# Patient Record
Sex: Male | Born: 1965 | Race: White | Hispanic: No | Marital: Married | Smoking: Never smoker
Health system: Southern US, Community
[De-identification: ages and names within clinical notes are randomized; demographics above are authoritative.]

## PROBLEM LIST (undated history)

## (undated) DIAGNOSIS — J45909 Unspecified asthma, uncomplicated: Secondary | ICD-10-CM

## (undated) HISTORY — PX: FOOT SURGERY: SHX648

## (undated) HISTORY — DX: Unspecified asthma, uncomplicated: J45.909

---

## 2007-07-11 IMAGING — CT NECK WO/W
2 series · 10 of 14 positions shown, 12 images · non-contrast
Comparison: none

[Series 2: without · axial · non-contrast · 0.49mm/px · z∈[-565,-445]mm · 3 of 48 slices shown]
[im 12/48  bone]
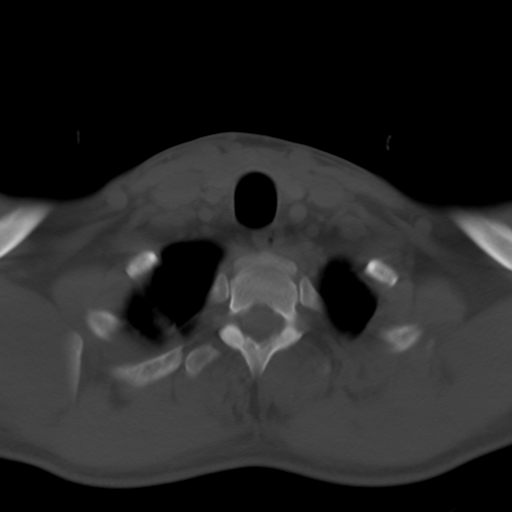
[im 24/48  bone]
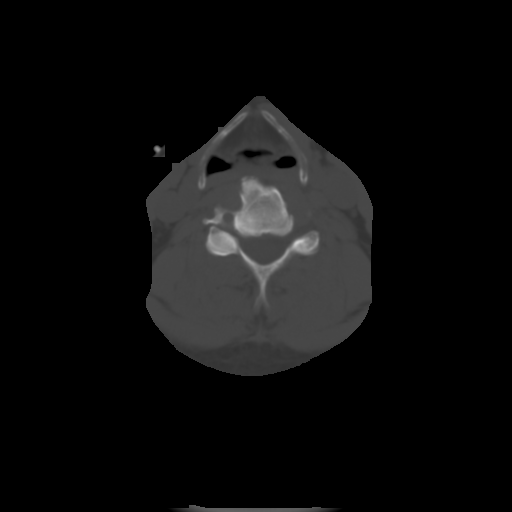
[im 36/48  bone]
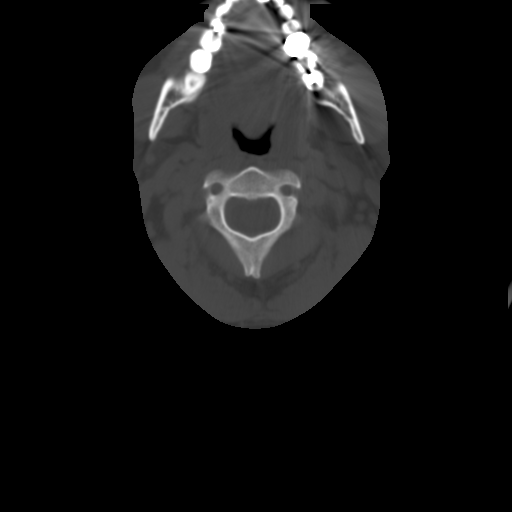

[Series 3: with contrast · axial · 0.49mm/px · z∈[-592,-415]mm · 7 of 79 slices shown, 9 images]
[im 10/79  soft-tissue]
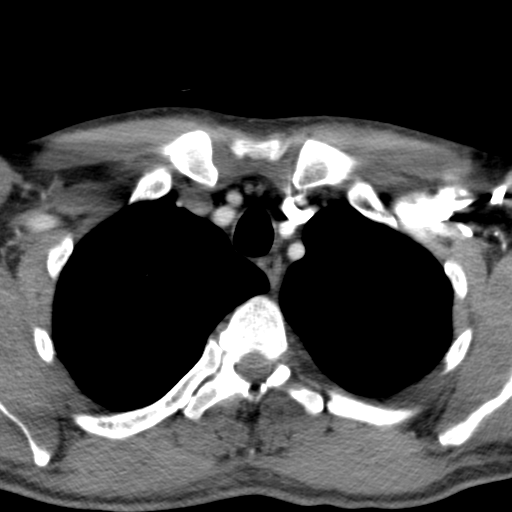
[im 10/79  bone]
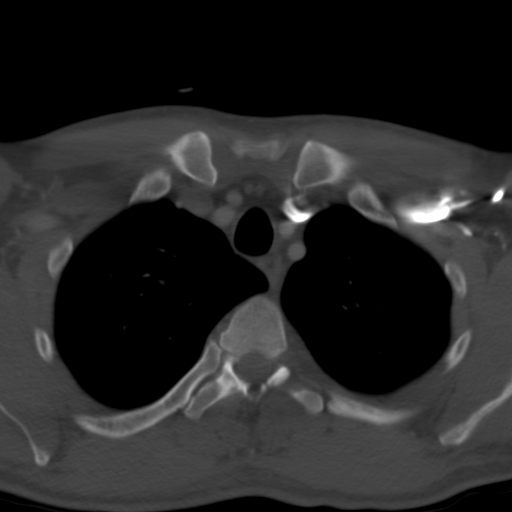
[im 20/79  bone]
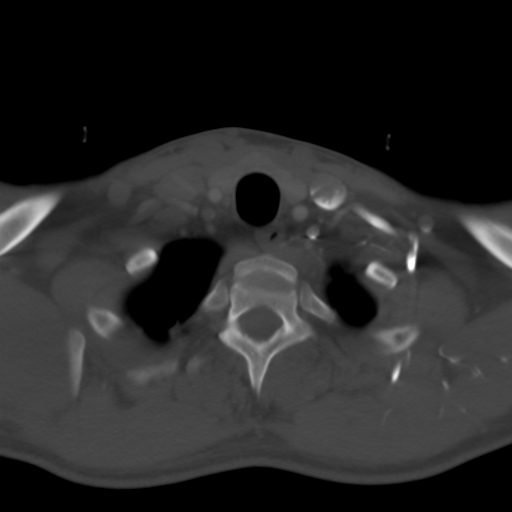
[im 30/79  bone]
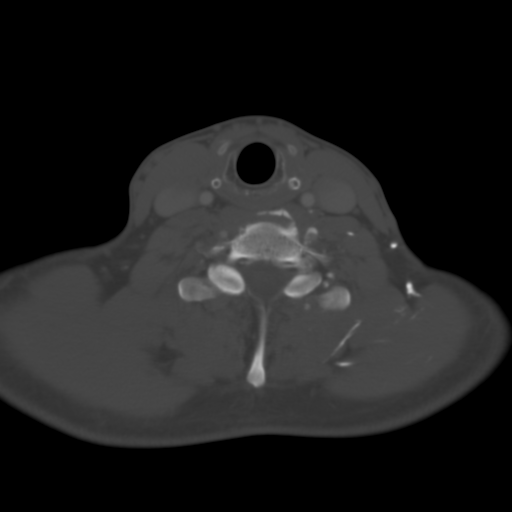
[im 40/79  bone]
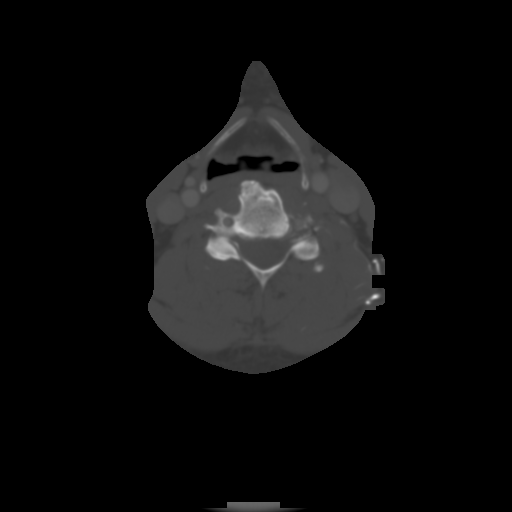
[im 49/79  soft-tissue]
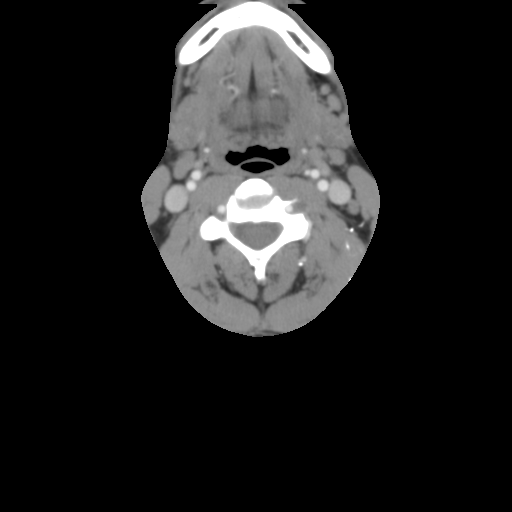
[im 49/79  bone]
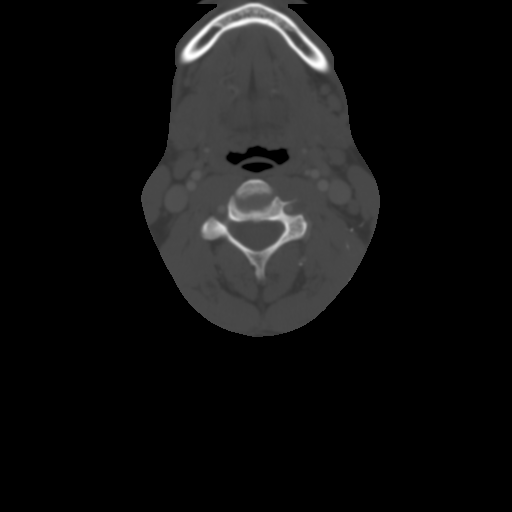
[im 59/79  bone]
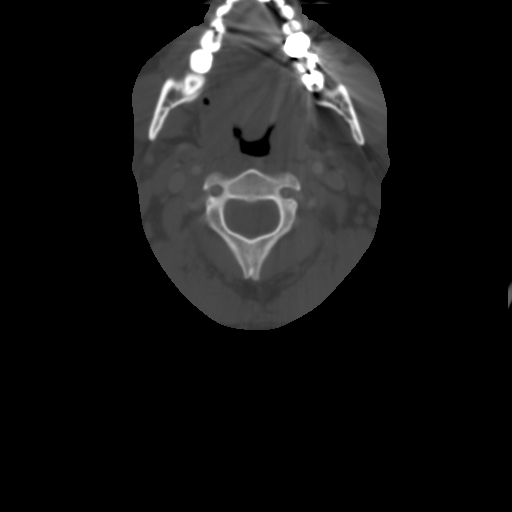
[im 69/79  bone]
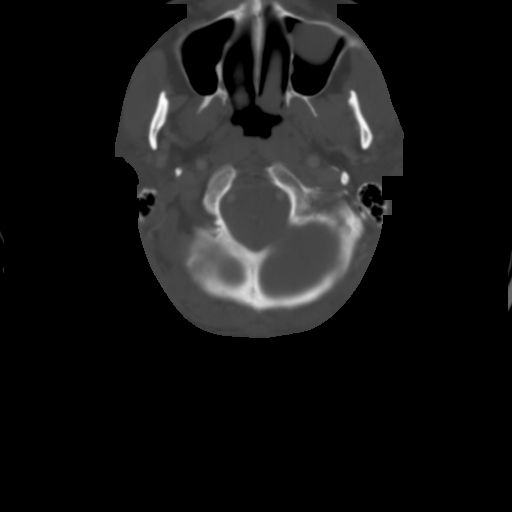

[10 of 14 positions shown; findings below may reference images not displayed]

______________________________________________________________
SANDISO, KWENZOKUHLE 03/06/1954 PANTHER, TOMO [DATE]

REASON FOR CONSULTATION: ROUTINE [id number]
____________________________________________

EXAM: SCREEN BREAST EXAM

The breasts are relatively dense with no significant interval change
observed since prior exam dated 04/25/93. There are a few tiny scattered
calcifications present. Routine follow-up is recommended in one year.

 Patient SANDISO, KWENZOKUHLE [DATE]

## 2008-05-12 ENCOUNTER — Ambulatory Visit (HOSPITAL_BASED_OUTPATIENT_CLINIC_OR_DEPARTMENT_OTHER): Admission: RE | Admit: 2008-05-12 | Discharge: 2008-05-12 | Payer: Self-pay | Admitting: Orthopedic Surgery

## 2010-12-05 NOTE — Op Note (Signed)
Matthew Huerta, Matthew Huerta               ACCOUNT NO.:  [id number]   MEDICAL RECORD NO.:  [id number]          PATIENT TYPE:  AMB   LOCATION:                               FACILITY:  [facility]   PHYSICIAN:  Feliberto Gottron. Turner Daniels, M.D.   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   PROCEDURE:  Left shoulder arthroscopic anterior-inferior acromioplasty  and distal clavicle excision.   SURGEON:  Feliberto Gottron. Turner Daniels, MD   FIRST ASSISTANT:  Shirl Harris, PA-C   ANESTHETIC:  General endotracheal with interscalene block.   ESTIMATED BLOOD LOSS:  Minimal.   FLUID REPLACEMENT:  700 mL of crystalloid.   DRAINS PLACED:  None.   TOURNIQUET TIME:  None.   INDICATIONS FOR PROCEDURE:  A 45 year old gentleman who was sent to our  office with an MRI scan showing a high-grade rotator cuff tear about a  week ago.  He was injured back in June, has had persistent pain,  catching and popping in the shoulder, had the MRI scan accomplished by  his primary care physician and was sent to [place] for consultation.  When we  saw him in the office, he had significant impingement, AC joint  tenderness.  Cross-chest adduction test was positive and based on these  clinical findings plus the MRI scan, it was felt that he would benefit  from arthroscopic evaluation and treatment of his shoulder with a mini  rotator cuff repair of the MRI was accurate.  Risks and benefits of  surgery were discussed and questions answered.   DESCRIPTION OF PROCEDURE:  The patient was identified by armband and  underwent left shoulder interscalene block anesthetic at the [facility] and then was taken  to operating room 1 where the  appropriate anesthetic monitors were reattached and general endotracheal  anesthesia was induced with the patient in the supine position.  He was  then placed in the beach-chair position.  The left upper extremity was  prepped and draped in usual sterile fashion from the wrist to the  hemithorax.  Time-out procedure was performed and we began the operation  itself by making standard portals with a #11 blade, 1.5 cm anterior to  the [facility] joint lateral to the junction of middle and posterior surface of  the acromion and posterolateral corner of the acromion process.  The  inflow was placed anteriorly.  The arthroscope laterally and a 4.2 great  white sucker shaver posteriorly.  Diagnostic arthroscopy revealed an  inflamed subacromial bursa, which was removed.  The patient has a very  large and sharp type 3 subacromial spur that was removed with a 4.5  hooded vortex bur and was about 1.2-cm in depth.  The [facility] joint was  arthritic and the inferior distal centimeter of the clavicle was also  excised with  the bur.  The rotator cuff was carefully examined  externally and had no significant tearing, although it was inflamed.  At  this point, we switched portals bringing the scope in posteriorly, the  inflow laterally, and the bur anteriorly, and completed our distal  clavicle excision.  Photographic documentation was made of this.  The  superior clavicular acromial ligament was left intact.  At this point,  the arthroscope was repositioned into the glenohumeral joint using a  posterior portal where we diagnosed a normal subscapularis insertion,  normal biceps, biceps anchor, and the rest the rotator cuff was  completely normal with no significant fraying except there was a little  bit of redness or inflammation.  The articular surface of the  glenohumeral joint was noted to be normal and the labrum was normal.  At  this point, the shoulder was irrigated out with normal  saline solution.  The arthroscopic instruments removed and a dressing of Xeroform, 4 x 4  dressing sponges, paper tape, and a sling applied.  The patient was laid  supine, awakened, and taken to the recovery room without difficulty.      Feliberto Gottron. Turner Daniels, M.D.  Electronically Signed     FJR/MEDQ  D:  05/12/2008  T:  05/13/2008  Job:  [id number]

## 2011-03-23 ENCOUNTER — Ambulatory Visit (HOSPITAL_BASED_OUTPATIENT_CLINIC_OR_DEPARTMENT_OTHER)
Admission: RE | Admit: 2011-03-23 | Discharge: 2011-03-23 | Disposition: A | Discharge status: Home / Self Care | Payer: 59 | Source: Ambulatory Visit | Attending: Podiatry | Admitting: Podiatry

## 2011-03-23 DIAGNOSIS — M2020 Hallux rigidus, unspecified foot: Secondary | ICD-10-CM | POA: Insufficient documentation

## 2011-03-23 DIAGNOSIS — M205X9 Other deformities of toe(s) (acquired), unspecified foot: Secondary | ICD-10-CM | POA: Insufficient documentation

## 2011-03-23 DIAGNOSIS — Z01812 Encounter for preprocedural laboratory examination: Secondary | ICD-10-CM | POA: Insufficient documentation

## 2011-03-23 DIAGNOSIS — M1A00X1 Idiopathic chronic gout, unspecified site, with tophus (tophi): Secondary | ICD-10-CM | POA: Insufficient documentation

## 2011-04-24 LAB — POCT HEMOGLOBIN-HEMACUE: Hemoglobin: 15.6

## 2013-09-18 ENCOUNTER — Telehealth: Payer: Self-pay | Admitting: *Deleted

## 2013-09-18 NOTE — Telephone Encounter (Signed)
Pt states he needs an appt is having pain in the joint replacement foot.  Referred to schedulers.

## 2013-09-18 NOTE — Telephone Encounter (Signed)
LEFT MSG FOR PT TO CALL BACK AND SCHEDULE AN APPOINTMENT

## 2013-09-29 ENCOUNTER — Encounter: Payer: Self-pay | Admitting: Podiatry

## 2013-09-29 ENCOUNTER — Ambulatory Visit (INDEPENDENT_AMBULATORY_CARE_PROVIDER_SITE_OTHER): Payer: PRIVATE HEALTH INSURANCE | Admitting: Podiatry

## 2013-09-29 ENCOUNTER — Ambulatory Visit (INDEPENDENT_AMBULATORY_CARE_PROVIDER_SITE_OTHER): Payer: PRIVATE HEALTH INSURANCE

## 2013-09-29 VITALS — BP 121/69 | HR 82 | Resp 16 | Ht 72.0 in | Wt 185.0 lb

## 2013-09-29 DIAGNOSIS — M79671 Pain in right foot: Secondary | ICD-10-CM

## 2013-09-29 DIAGNOSIS — M79609 Pain in unspecified limb: Secondary | ICD-10-CM

## 2013-09-29 DIAGNOSIS — M7750 Other enthesopathy of unspecified foot: Secondary | ICD-10-CM

## 2013-09-29 DIAGNOSIS — M7751 Other enthesopathy of right foot: Secondary | ICD-10-CM

## 2013-09-29 MED ORDER — METHYLPREDNISOLONE (PAK) 4 MG PO TABS: ORAL_TABLET | ORAL | Status: DC

## 2013-09-29 NOTE — Progress Notes (Signed)
Right foot where i had surgery is hurting for the last 5 - 6 weeks . He denies trauma to the foot. He states that the majority of the pain is right in here as he points to the plantar aspect of the second metatarsophalangeal joint right foot. He does relate some pain to the hallux itself.  Objective: Vital signs are stable he is alert and oriented x3. Pulses are palpable bilateral range of motion of the first metatarsophalangeal joint of the right foot where a Keller arthroplasty silicone implant was performed he has good range of motion here and it is nontender on range of motion. He has pain on in range of motion of the second metatarsophalangeal joint of the right foot. Radiographic evaluation does demonstrate some bony overgrowth to the first metatarsal phalangeal joint arthroplasty this is possibly resulting in limitation of range of motion. This is more than likely resulting in lateralization and capsulitis of the second metatarsophalangeal joint. Radiographic evaluation does not demonstrate any type of other osseous abnormalities.  Assessment: Capsulitis second metatarsophalangeal joint right foot. 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  Plan: Started him on a Medrol Dosepak today and injected periarticularly about the second metatarsophalangeal joint with dexamethasone and local anesthetic. We'll consider orthotics with a Morton's extension.

## 2013-10-22 ENCOUNTER — Encounter: Payer: Self-pay | Admitting: Podiatry

## 2013-10-22 ENCOUNTER — Ambulatory Visit (INDEPENDENT_AMBULATORY_CARE_PROVIDER_SITE_OTHER): Payer: PRIVATE HEALTH INSURANCE | Admitting: Podiatry

## 2013-10-22 ENCOUNTER — Ambulatory Visit: Payer: PRIVATE HEALTH INSURANCE | Admitting: Podiatry

## 2013-10-22 VITALS — BP 124/79 | HR 91 | Resp 16

## 2013-10-22 DIAGNOSIS — M7751 Other enthesopathy of right foot: Secondary | ICD-10-CM

## 2013-10-22 DIAGNOSIS — M7750 Other enthesopathy of unspecified foot: Secondary | ICD-10-CM

## 2013-10-22 MED ORDER — OXYCODONE-ACETAMINOPHEN 10-325 MG PO TABS: 1.0000 | ORAL_TABLET | ORAL | Status: DC | PRN

## 2013-10-22 NOTE — Progress Notes (Signed)
Presents today for followup of capsulitis second metatarsophalangeal joint of the right foot. Some tenderness to the medial aspect of the right foot particularly along the [facility].  Objective: Vital signs are stable he is alert and oriented x3. He has pain on palpation and on end range of motion of the second metatarsophalangeal joint of the right foot.  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 Bony overgrowth and spurring status post Keller arthroplasty with single silicone implant.  Plan: Discussed etiology pathology conservative versus surgical therapies. Injected with dexamethasone and local anesthetic to the point of maximal tenderness second digit right foot with in the joint. After sterile Betadine skin prep this 2 mg of dexamethasone was injected intra-articularly. I also wrote her prescription for Percocet. Remember to ask and have a history of 2 [employer] with.

## 2013-11-19 ENCOUNTER — Ambulatory Visit: Payer: PRIVATE HEALTH INSURANCE | Admitting: Podiatry

## 2013-12-08 ENCOUNTER — Encounter: Payer: Self-pay | Admitting: Podiatry

## 2013-12-08 ENCOUNTER — Ambulatory Visit (INDEPENDENT_AMBULATORY_CARE_PROVIDER_SITE_OTHER): Payer: PRIVATE HEALTH INSURANCE | Admitting: Podiatry

## 2013-12-08 VITALS — BP 121/68 | HR 80 | Resp 12

## 2013-12-08 DIAGNOSIS — M7751 Other enthesopathy of right foot: Secondary | ICD-10-CM

## 2013-12-08 DIAGNOSIS — M7750 Other enthesopathy of unspecified foot: Secondary | ICD-10-CM

## 2013-12-08 MED ORDER — OXYCODONE-ACETAMINOPHEN 10-325 MG PO TABS: 1.0000 | ORAL_TABLET | ORAL | Status: DC | PRN

## 2013-12-08 NOTE — Progress Notes (Signed)
He presents today for followup of her capsulitis to the second metatarsophalangeal joint of his right foot. He states it really has been no change other than a few days relief after the last injection.  Objective: Vital signs are stable he is alert and oriented x3. He has pain on in range of motion and on palpation of the second metatarsophalangeal joint of the right foot.  Assessment: Capsulitis of the second metatarsophalangeal joint right foot he alleviation of the second toe; medial dislocation syndrome.  Plan: Dispensed prescription for orthotics today he was scanned before leaving the office I will followup with him once those come in at which time we will give him another injection. I also dispensed a prescription for short-term narcotics for pain.

## 2014-01-01 ENCOUNTER — Ambulatory Visit (INDEPENDENT_AMBULATORY_CARE_PROVIDER_SITE_OTHER): Payer: PRIVATE HEALTH INSURANCE | Admitting: *Deleted

## 2014-01-01 ENCOUNTER — Other Ambulatory Visit: Payer: PRIVATE HEALTH INSURANCE

## 2014-01-01 DIAGNOSIS — M7751 Other enthesopathy of right foot: Secondary | ICD-10-CM

## 2014-01-01 DIAGNOSIS — M7750 Other enthesopathy of unspecified foot: Secondary | ICD-10-CM

## 2014-01-01 NOTE — Patient Instructions (Signed)

## 2014-01-01 NOTE — Progress Notes (Signed)
   Subjective:    Patient ID: Matthew Huerta, male    DOB: 01/08/1966, 48 y.o.   MRN: [id number]  HPI  PICK UP ORTHOTICS AND GIVEN INSTRUCTION.    Review of Systems     Objective:   Physical Exam        Assessment & Plan:

## 2015-05-03 ENCOUNTER — Ambulatory Visit (INDEPENDENT_AMBULATORY_CARE_PROVIDER_SITE_OTHER): Payer: No Typology Code available for payment source

## 2015-05-03 ENCOUNTER — Ambulatory Visit (INDEPENDENT_AMBULATORY_CARE_PROVIDER_SITE_OTHER): Payer: No Typology Code available for payment source | Admitting: Podiatry

## 2015-05-03 ENCOUNTER — Encounter: Payer: Self-pay | Admitting: Podiatry

## 2015-05-03 VITALS — BP 113/70 | HR 73 | Resp 16

## 2015-05-03 DIAGNOSIS — M779 Enthesopathy, unspecified: Secondary | ICD-10-CM

## 2015-05-03 DIAGNOSIS — M79672 Pain in left foot: Secondary | ICD-10-CM | POA: Diagnosis not present

## 2015-05-03 DIAGNOSIS — M778 Other enthesopathies, not elsewhere classified: Secondary | ICD-10-CM

## 2015-05-03 DIAGNOSIS — M10072 Idiopathic gout, left ankle and foot: Secondary | ICD-10-CM

## 2015-05-03 DIAGNOSIS — M7752 Other enthesopathy of left foot: Secondary | ICD-10-CM | POA: Diagnosis not present

## 2015-05-03 LAB — C-REACTIVE PROTEIN: CRP: <0.5 mg/dL (ref <0.60)

## 2015-05-03 LAB — RHEUMATOID FACTOR: Rhuematoid fact SerPl-aCnc: <10 IU/mL (ref <=14)

## 2015-05-03 LAB — URIC ACID: Uric Acid, Serum: 7 mg/dL (ref 4.0–7.8)

## 2015-05-03 MED ORDER — METHYLPREDNISOLONE 4 MG PO TBPK: ORAL_TABLET | ORAL | Status: DC

## 2015-05-03 MED ORDER — INDOMETHACIN 50 MG PO CAPS: 50.0000 mg | ORAL_CAPSULE | Freq: Two times a day (BID) | ORAL | Status: DC

## 2015-05-03 NOTE — Progress Notes (Signed)
He presents today with chief complaint of painful red hallux left. States that he woke up with it red and painful yesterday morning. He denies any trauma denies a history of gout. Denies fever chills nausea vomiting muscle aches and pains. Says that he is done nothing for the toe. He cannot stand anything to touch it.  Objective: Vital signs are stable he is alert and oriented 3. Pulses are palpable. Hallux left does demonstrate mild hallux malleus with a hypertrophic hallux interphalangeal joint. The toe was red hot swollen particularly the dorsal medial aspect of the IP joint. Radiographs do not demonstrate any type of osseus abnormalities in the area. No fractures identified.  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  Plan: Discussed etiology pathology conservative versus surgical therapies. At this point I injected Kenalog periarticular around the IP joint. Placed him on Medrol Dosepak to be followed by Indocin. We also wrote a requisition for blood work to evaluate him for arthritis and uric acid elevation.  Arbutus Ped DPM

## 2015-05-03 NOTE — Patient Instructions (Signed)

## 2015-05-04 ENCOUNTER — Telehealth: Payer: Self-pay | Admitting: *Deleted

## 2015-05-04 LAB — ANA: Anti Nuclear Antibody(ANA): NEGATIVE

## 2015-05-04 LAB — SEDIMENTATION RATE: Sed Rate: 1 mm/hr (ref 0–15)

## 2015-05-04 NOTE — Telephone Encounter (Addendum)
-----   Message from Elinor ParkinsonMax T Hyatt, [place] sent at 05/04/2015 12:03 PM EDT ----- Uric acid result demonstrate a "high normal"  Probably was a  Low grade gout attack.  Orders called to pt.

## 2015-05-24 ENCOUNTER — Ambulatory Visit (INDEPENDENT_AMBULATORY_CARE_PROVIDER_SITE_OTHER): Payer: No Typology Code available for payment source | Admitting: Podiatry

## 2015-05-24 ENCOUNTER — Encounter: Payer: Self-pay | Admitting: Podiatry

## 2015-05-24 VITALS — BP 110/67 | HR 65 | Resp 16

## 2015-05-24 DIAGNOSIS — M7752 Other enthesopathy of left foot: Secondary | ICD-10-CM

## 2015-05-24 DIAGNOSIS — M778 Other enthesopathies, not elsewhere classified: Secondary | ICD-10-CM

## 2015-05-24 DIAGNOSIS — M10072 Idiopathic gout, left ankle and foot: Secondary | ICD-10-CM | POA: Diagnosis not present

## 2015-05-24 DIAGNOSIS — M779 Enthesopathy, unspecified: Principal | ICD-10-CM

## 2015-05-24 NOTE — Progress Notes (Signed)
He presents today for follow-up of capsulitis of his IP joint hallux left. He states this seems to be doing much better. He has some tenderness second metatarsophalangeal joint level of the right foot on occasions. He states that this is nothing to worry about it comes and goes securely with shoe gear. The left foot however he states that after the steroid shot and the prednisone 3 days later he was doing much better.  Objective: Vital signs are stable he is alert and oriented 3. Toe does not demonstrate any erythema or edema saline as drainage or odor. No tenderness on range of motion. Pulses remain palpable. His lab report did come back with a uric acid of 7.4 which is on the high-end of normal particular for uric acid.  Assessment well-healing gouty capsulitis left.  Plan: Dispensed him a gout diet and discuss the need for follow-up should this recur.  Matthew Huerta DPM

## 2015-05-24 NOTE — Patient Instructions (Signed)

## 2017-10-14 ENCOUNTER — Telehealth: Payer: Self-pay | Admitting: Podiatry

## 2017-10-14 NOTE — Telephone Encounter (Signed)
Left message informing pt Dr. Al CorpusHyatt wanted him to make an appt and he could call early tomorrow our schedulers [phone] or [phone] may be able to get him an early appt.

## 2017-10-14 NOTE — Telephone Encounter (Signed)
I was seen by Dr. Al CorpusHyatt for a joint replacement surgery on my foot and he also saw me for gout probably 2 years ago. I just had a flare up of gout and I'm not sure if there is something that can be prescribed for that or anything that can be done. It probably started Thursday of last week and it's still pretty swollen and painful. My number is [phone]. Thanks.

## 2017-10-15 ENCOUNTER — Ambulatory Visit (INDEPENDENT_AMBULATORY_CARE_PROVIDER_SITE_OTHER): Payer: Managed Care, Other (non HMO)

## 2017-10-15 ENCOUNTER — Ambulatory Visit: Payer: Managed Care, Other (non HMO) | Admitting: Podiatry

## 2017-10-15 ENCOUNTER — Encounter: Payer: Self-pay | Admitting: Podiatry

## 2017-10-15 DIAGNOSIS — M109 Gout, unspecified: Secondary | ICD-10-CM

## 2017-10-15 MED ORDER — METHYLPREDNISOLONE 4 MG PO TBPK: ORAL_TABLET | ORAL | 0 refills | Status: AC

## 2017-10-15 MED ORDER — COLCHICINE 0.6 MG PO TABS: ORAL_TABLET | ORAL | 3 refills | Status: AC

## 2017-10-15 NOTE — Progress Notes (Signed)
Matthew Huerta presents today for chief complaint of a painful hallux interphalangeal joint has been hurting and red swollen since Wednesday of last week.  He states that he is taken some ibuprofen which seemed to help.  He denies fever chills nausea vomiting muscle aches and pains.  Objective: Vital signs are stable alert and oriented x3.  Pulses are palpable.  Neurologic sensorium is intact.  Red warm erythematous inflamed hallux interphalangeal joint left with a nonpalpable soft tissue mass.  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  Assessment: Gout hallux interphalangeal joint left.  Plan: Started him on 0.6 mg of colchicine also injected the area.  Particularly today with 20 mg of Kenalog 5 mg of Marcaine after the area was prepped and draped as normal sterile fashion.  I also started him on a Medrol Dosepak I will follow-up with him in 1-2 weeks if necessary.
